# Patient Record
Sex: Male | Born: 1955 | Race: White | Hispanic: No | Smoking: Never smoker
Health system: Southern US, Community
[De-identification: ages and names within clinical notes are randomized; demographics above are authoritative.]

## PROBLEM LIST (undated history)

## (undated) DIAGNOSIS — I10 Essential (primary) hypertension: Secondary | ICD-10-CM

## (undated) DIAGNOSIS — E119 Type 2 diabetes mellitus without complications: Secondary | ICD-10-CM

## (undated) DIAGNOSIS — M109 Gout, unspecified: Secondary | ICD-10-CM

## (undated) DIAGNOSIS — N189 Chronic kidney disease, unspecified: Secondary | ICD-10-CM

## (undated) DIAGNOSIS — E039 Hypothyroidism, unspecified: Secondary | ICD-10-CM

## (undated) DIAGNOSIS — C73 Malignant neoplasm of thyroid gland: Secondary | ICD-10-CM

## (undated) HISTORY — PX: COLONOSCOPY: SHX174

---
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
## 2007-03-25 ENCOUNTER — Ambulatory Visit: Payer: Self-pay | Admitting: Gastroenterology

## 2012-04-13 ENCOUNTER — Ambulatory Visit: Payer: Self-pay | Admitting: Internal Medicine

## 2012-04-14 ENCOUNTER — Other Ambulatory Visit: Payer: Self-pay | Admitting: Podiatry

## 2012-04-14 LAB — SYNOVIAL CELL COUNT + DIFF, W/ CRYSTALS
Basophil: 0 %
Crystals, Joint Fluid: NONE SEEN
Eosinophil: 0 %
Lymphocytes: 68 %
Neutrophils: 3 %
Nucleated Cell Count: 68 /mm3
Other Cells BF: 0 %
Other Mononuclear Cells: 29 %

## 2014-11-02 DIAGNOSIS — C73 Malignant neoplasm of thyroid gland: Secondary | ICD-10-CM

## 2014-11-02 HISTORY — DX: Malignant neoplasm of thyroid gland: C73

## 2015-06-03 ENCOUNTER — Encounter
Admission: RE | Admit: 2015-06-03 | Discharge: 2015-06-03 | Disposition: A | Discharge status: Home / Self Care | Payer: PRIVATE HEALTH INSURANCE | Source: Ambulatory Visit | Attending: Otolaryngology | Admitting: Otolaryngology

## 2015-06-03 DIAGNOSIS — Z01810 Encounter for preprocedural cardiovascular examination: Secondary | ICD-10-CM | POA: Diagnosis not present

## 2015-06-03 DIAGNOSIS — Z01812 Encounter for preprocedural laboratory examination: Secondary | ICD-10-CM | POA: Insufficient documentation

## 2015-06-03 HISTORY — DX: Essential (primary) hypertension: I10

## 2015-06-03 HISTORY — DX: Type 2 diabetes mellitus without complications: E11.9

## 2015-06-03 HISTORY — DX: Chronic kidney disease, unspecified: N18.9

## 2015-06-03 LAB — BASIC METABOLIC PANEL
Anion gap: 8 (ref 5–15)
BUN: 20 mg/dL (ref 6–20)
CO2: 26 mmol/L (ref 22–32)
CREATININE: 1.06 mg/dL (ref 0.61–1.24)
Calcium: 9.3 mg/dL (ref 8.9–10.3)
Chloride: 103 mmol/L (ref 101–111)
Glucose, Bld: 155 mg/dL — ABNORMAL HIGH (ref 65–99)
Potassium: 3.2 mmol/L — ABNORMAL LOW (ref 3.5–5.1)
Sodium: 137 mmol/L (ref 135–145)

## 2015-06-03 NOTE — Pre-Procedure Instructions (Signed)
Met B results (potassium 3.2) faxed to Dr. Richardson Landry office, results given to [facility] and informed patient needed to be started on potassium supplement.Marland Kitchen

## 2015-06-03 NOTE — Patient Instructions (Signed)
  Your procedure is scheduled on: June 12, 2015 (Wednesday) Report to Day Surgery. To find out your arrival time please call [phone] between 1PM - 3PM on June 11, 2015 (Tuesday).  Remember: Instructions that are not followed completely may result in serious medical risk, up to and including death, or upon the discretion of your surgeon and anesthesiologist your surgery may need to be rescheduled.    __x__ 1. Do not eat food or drink liquids after midnight. No gum chewing or hard candies.     ____ 2. No Alcohol for 24 hours before or after surgery.   ____ 3. Bring all medications with you on the day of surgery if instructed.    ____ 4. Notify your doctor if there is any change in your medical condition     (cold, fever, infections).     Do not wear jewelry, make-up, hairpins, clips or nail polish.  Do not wear lotions, powders, or perfumes. You may wear deodorant.  Do not shave 48 hours prior to surgery. Men may shave face and neck.  Do not bring valuables to the hospital.    [facility] is not responsible for any belongings or valuables.               Contacts, dentures or bridgework may not be worn into surgery.  Leave your suitcase in the car. After surgery it may be brought to your room.  For patients admitted to the hospital, discharge time is determined by your                treatment team.   Patients discharged the day of surgery will not be allowed to drive home.   Please read over the following fact sheets that you were given:      __x__ Take these medicines the morning of surgery with A SIP OF WATER:    1. Metoprolol  2.   3.   4.  5.  6.  ____ Fleet Enema (as directed)   ____ Use CHG Soap as directed  ____ Use inhalers on the day of surgery  ____ Stop metformin 2 days prior to surgery    ____ Take 1/2 of usual insulin dose the night before surgery and none on the morning of surgery.   ____ Stop Coumadin/Plavix/aspirin on   __x__ Stop  Anti-inflammatories on (STOP [place])   ____ Stop supplements until after surgery.    ____ Bring C-Pap to the hospital.

## 2015-06-12 ENCOUNTER — Encounter
Admission: RE | Disposition: A | Discharge status: Home / Self Care | Payer: Self-pay | Source: Ambulatory Visit | Attending: Otolaryngology

## 2015-06-12 ENCOUNTER — Observation Stay
Admission: RE | Admit: 2015-06-12 | Discharge: 2015-06-13 | Disposition: A | Discharge status: Home / Self Care | Payer: PRIVATE HEALTH INSURANCE | Source: Ambulatory Visit | Attending: Otolaryngology | Admitting: Otolaryngology

## 2015-06-12 ENCOUNTER — Ambulatory Visit: Payer: PRIVATE HEALTH INSURANCE | Admitting: Anesthesiology

## 2015-06-12 ENCOUNTER — Encounter: Payer: Self-pay | Admitting: *Deleted

## 2015-06-12 DIAGNOSIS — C73 Malignant neoplasm of thyroid gland: Secondary | ICD-10-CM | POA: Diagnosis not present

## 2015-06-12 DIAGNOSIS — Z87442 Personal history of urinary calculi: Secondary | ICD-10-CM | POA: Diagnosis not present

## 2015-06-12 DIAGNOSIS — E1122 Type 2 diabetes mellitus with diabetic chronic kidney disease: Secondary | ICD-10-CM | POA: Insufficient documentation

## 2015-06-12 DIAGNOSIS — N182 Chronic kidney disease, stage 2 (mild): Secondary | ICD-10-CM | POA: Diagnosis not present

## 2015-06-12 DIAGNOSIS — Z833 Family history of diabetes mellitus: Secondary | ICD-10-CM | POA: Insufficient documentation

## 2015-06-12 DIAGNOSIS — Z809 Family history of malignant neoplasm, unspecified: Secondary | ICD-10-CM | POA: Diagnosis not present

## 2015-06-12 DIAGNOSIS — E063 Autoimmune thyroiditis: Secondary | ICD-10-CM | POA: Diagnosis not present

## 2015-06-12 DIAGNOSIS — Z7982 Long term (current) use of aspirin: Secondary | ICD-10-CM | POA: Diagnosis not present

## 2015-06-12 DIAGNOSIS — Z79899 Other long term (current) drug therapy: Secondary | ICD-10-CM | POA: Insufficient documentation

## 2015-06-12 DIAGNOSIS — I129 Hypertensive chronic kidney disease with stage 1 through stage 4 chronic kidney disease, or unspecified chronic kidney disease: Secondary | ICD-10-CM | POA: Insufficient documentation

## 2015-06-12 DIAGNOSIS — Z86010 Personal history of colonic polyps: Secondary | ICD-10-CM | POA: Diagnosis not present

## 2015-06-12 DIAGNOSIS — Z8249 Family history of ischemic heart disease and other diseases of the circulatory system: Secondary | ICD-10-CM | POA: Insufficient documentation

## 2015-06-12 DIAGNOSIS — E049 Nontoxic goiter, unspecified: Secondary | ICD-10-CM | POA: Diagnosis present

## 2015-06-12 DIAGNOSIS — E042 Nontoxic multinodular goiter: Secondary | ICD-10-CM | POA: Diagnosis present

## 2015-06-12 HISTORY — PX: THYROIDECTOMY: SHX17

## 2015-06-12 LAB — GLUCOSE, CAPILLARY
GLUCOSE-CAPILLARY: 130 mg/dL — AB (ref 65–99)
Glucose-Capillary: 128 mg/dL — ABNORMAL HIGH (ref 65–99)

## 2015-06-12 LAB — CALCIUM: CALCIUM: 8.4 mg/dL — AB (ref 8.9–10.3)

## 2015-06-12 SURGERY — THYROIDECTOMY
Anesthesia: General | Wound class: Clean

## 2015-06-12 MED ORDER — KCL IN DEXTROSE-NACL 20-5-0.45 MEQ/L-%-% IV SOLN
INTRAVENOUS | Status: DC
  Administered 2015-06-12 – 2015-06-13 (×3): via INTRAVENOUS
  Filled 2015-06-12 (×6): qty 1000

## 2015-06-12 MED ORDER — METOPROLOL TARTRATE 50 MG PO TABS: 100.0000 mg | ORAL_TABLET | Freq: Every morning | ORAL | Status: DC

## 2015-06-12 MED ORDER — LOSARTAN POTASSIUM 50 MG PO TABS: 100.0000 mg | ORAL_TABLET | Freq: Every day | ORAL | Status: DC

## 2015-06-12 MED ORDER — ONDANSETRON HCL 4 MG PO TABS: 4.0000 mg | ORAL_TABLET | ORAL | Status: DC | PRN

## 2015-06-12 MED ORDER — HYDROCHLOROTHIAZIDE 25 MG PO TABS: 25.0000 mg | ORAL_TABLET | Freq: Every day | ORAL | Status: DC

## 2015-06-12 MED ORDER — ONDANSETRON HCL 4 MG/2ML IJ SOLN: 4.0000 mg | INTRAMUSCULAR | Status: DC | PRN

## 2015-06-12 MED ORDER — DOCUSATE SODIUM 100 MG PO CAPS
100.0000 mg | ORAL_CAPSULE | Freq: Two times a day (BID) | ORAL | Status: DC
  Administered 2015-06-12 (×2): 100 mg via ORAL
  Filled 2015-06-12 (×2): qty 1

## 2015-06-12 MED ORDER — MORPHINE SULFATE 2 MG/ML IJ SOLN
2.0000 mg | INTRAMUSCULAR | Status: DC | PRN
Start: 2015-06-12 — End: 2015-06-13
  Administered 2015-06-12: 4 mg via INTRAVENOUS
  Filled 2015-06-12: qty 2

## 2015-06-12 MED ORDER — BACITRACIN 500 UNIT/GM EX OINT
TOPICAL_OINTMENT | CUTANEOUS | Status: DC | PRN
  Administered 2015-06-12: 1 via TOPICAL

## 2015-06-12 MED ORDER — FAMOTIDINE 20 MG PO TABS
20.0000 mg | ORAL_TABLET | Freq: Once | ORAL | Status: AC
  Administered 2015-06-12: 20 mg via ORAL

## 2015-06-12 MED ORDER — FENTANYL CITRATE (PF) 100 MCG/2ML IJ SOLN
25.0000 ug | INTRAMUSCULAR | Status: AC | PRN
  Administered 2015-06-12 (×6): 25 ug via INTRAVENOUS

## 2015-06-12 MED ORDER — BACITRACIN ZINC 500 UNIT/GM EX OINT
1.0000 "application " | TOPICAL_OINTMENT | Freq: Three times a day (TID) | CUTANEOUS | Status: DC
  Administered 2015-06-12 – 2015-06-13 (×3): 1 via TOPICAL
  Filled 2015-06-12: qty 28.35

## 2015-06-12 MED ORDER — ONDANSETRON HCL 4 MG/2ML IJ SOLN: 4.0000 mg | Freq: Once | INTRAMUSCULAR | Status: DC | PRN

## 2015-06-12 MED ORDER — SODIUM CHLORIDE 0.9 % IV SOLN
10000.0000 ug | INTRAVENOUS | Status: DC | PRN
  Administered 2015-06-12: 50 ug/min via INTRAVENOUS

## 2015-06-12 MED ORDER — LIDOCAINE-EPINEPHRINE (PF) 1 %-1:200000 IJ SOLN
INTRAMUSCULAR | Status: AC
  Filled 2015-06-12: qty 30

## 2015-06-12 MED ORDER — PROPOFOL 10 MG/ML IV BOLUS
INTRAVENOUS | Status: DC | PRN
  Administered 2015-06-12: 170 mg via INTRAVENOUS

## 2015-06-12 MED ORDER — FAMOTIDINE 20 MG PO TABS
ORAL_TABLET | ORAL | Status: AC
  Filled 2015-06-12: qty 1

## 2015-06-12 MED ORDER — SUCCINYLCHOLINE CHLORIDE 20 MG/ML IJ SOLN
INTRAMUSCULAR | Status: DC | PRN
  Administered 2015-06-12: 120 mg via INTRAVENOUS

## 2015-06-12 MED ORDER — SODIUM CHLORIDE 0.9 % IV SOLN
INTRAVENOUS | Status: DC
  Administered 2015-06-12 (×2): via INTRAVENOUS

## 2015-06-12 MED ORDER — CALCIUM CARBONATE-VITAMIN D 500-200 MG-UNIT PO TABS
2.0000 | ORAL_TABLET | Freq: Two times a day (BID) | ORAL | Status: DC
  Administered 2015-06-12 (×2): 2 via ORAL
  Filled 2015-06-12 (×3): qty 2

## 2015-06-12 MED ORDER — LIDOCAINE HCL (CARDIAC) 20 MG/ML IV SOLN
INTRAVENOUS | Status: DC | PRN
  Administered 2015-06-12: 60 mg via INTRAVENOUS

## 2015-06-12 MED ORDER — LIDOCAINE-EPINEPHRINE (PF) 1 %-1:200000 IJ SOLN
INTRAMUSCULAR | Status: DC | PRN
  Administered 2015-06-12: 6 mL

## 2015-06-12 MED ORDER — HYDROCODONE-ACETAMINOPHEN 5-325 MG PO TABS
1.0000 | ORAL_TABLET | ORAL | Status: DC | PRN
Start: 2015-06-12 — End: 2015-06-13
  Administered 2015-06-12 (×2): 2 via ORAL
  Filled 2015-06-12 (×2): qty 2

## 2015-06-12 MED ORDER — LEVOTHYROXINE SODIUM 75 MCG PO TABS
125.0000 ug | ORAL_TABLET | Freq: Every day | ORAL | Status: DC
  Administered 2015-06-13: 125 ug via ORAL
  Filled 2015-06-12: qty 1

## 2015-06-12 MED ORDER — BACITRACIN ZINC 500 UNIT/GM EX OINT
TOPICAL_OINTMENT | CUTANEOUS | Status: AC
  Filled 2015-06-12: qty 28.35

## 2015-06-12 MED ORDER — EPHEDRINE SULFATE 50 MG/ML IJ SOLN
INTRAMUSCULAR | Status: DC | PRN
  Administered 2015-06-12 (×2): 10 mg via INTRAVENOUS
  Administered 2015-06-12: 5 mg via INTRAVENOUS

## 2015-06-12 MED ORDER — HYDROCHLOROTHIAZIDE 25 MG PO TABS
25.0000 mg | ORAL_TABLET | Freq: Every day | ORAL | Status: DC
  Filled 2015-06-12 (×2): qty 1

## 2015-06-12 MED ORDER — LOSARTAN POTASSIUM 50 MG PO TABS
100.0000 mg | ORAL_TABLET | Freq: Every day | ORAL | Status: DC
  Filled 2015-06-12 (×2): qty 2

## 2015-06-12 MED ORDER — FENTANYL CITRATE (PF) 100 MCG/2ML IJ SOLN
INTRAMUSCULAR | Status: AC
  Administered 2015-06-12: 25 ug via INTRAVENOUS
  Filled 2015-06-12: qty 2

## 2015-06-12 MED ORDER — MIDAZOLAM HCL 2 MG/2ML IJ SOLN
INTRAMUSCULAR | Status: DC | PRN
  Administered 2015-06-12: 2 mg via INTRAVENOUS

## 2015-06-12 MED ORDER — 0.9 % SODIUM CHLORIDE (POUR BTL) OPTIME
TOPICAL | Status: DC | PRN
  Administered 2015-06-12: 150 mL

## 2015-06-12 MED ORDER — FENTANYL CITRATE (PF) 100 MCG/2ML IJ SOLN
INTRAMUSCULAR | Status: DC | PRN
  Administered 2015-06-12: 25 ug via INTRAVENOUS
  Administered 2015-06-12 (×2): 50 ug via INTRAVENOUS
  Administered 2015-06-12: 25 ug via INTRAVENOUS
  Administered 2015-06-12: 100 ug via INTRAVENOUS
  Administered 2015-06-12: 25 ug via INTRAVENOUS
  Administered 2015-06-12 (×2): 50 ug via INTRAVENOUS
  Administered 2015-06-12: 100 ug via INTRAVENOUS

## 2015-06-12 MED ORDER — PHENYLEPHRINE HCL 10 MG/ML IJ SOLN
INTRAMUSCULAR | Status: DC | PRN
  Administered 2015-06-12 (×2): 100 ug via INTRAVENOUS
  Administered 2015-06-12: 200 ug via INTRAVENOUS

## 2015-06-12 MED ORDER — ONDANSETRON HCL 4 MG/2ML IJ SOLN
INTRAMUSCULAR | Status: DC | PRN
  Administered 2015-06-12: 4 mg via INTRAVENOUS

## 2015-06-12 SURGICAL SUPPLY — 36 items
BLADE SURG 15 STRL LF DISP TIS (BLADE) ×1 IMPLANT
BLADE SURG 15 STRL SS (BLADE) ×2
CANISTER SUCT 1200ML W/VALVE (MISCELLANEOUS) ×3 IMPLANT
CORD BIP STRL DISP 12FT (MISCELLANEOUS) ×3 IMPLANT
DRAIN TLS ROUND 10FR (DRAIN) ×6 IMPLANT
DRAPE MAG INST 16X20 L/F (DRAPES) ×3 IMPLANT
DRSG TEGADERM 2-3/8X2-3/4 SM (GAUZE/BANDAGES/DRESSINGS) IMPLANT
ELECT LARYNGEAL 6/7 (MISCELLANEOUS)
ELECT LARYNGEAL 8/9 (MISCELLANEOUS) ×3
ELECTRODE LARYNGEAL 6/7 (MISCELLANEOUS) IMPLANT
ELECTRODE LARYNGEAL 8/9 (MISCELLANEOUS) ×1 IMPLANT
FORCEPS JEWEL BIP 4-3/4 STR (INSTRUMENTS) ×3 IMPLANT
GLOVE BIO SURGEON STRL SZ7.5 (GLOVE) ×18 IMPLANT
GOWN STRL REUS W/ TWL LRG LVL3 (GOWN DISPOSABLE) ×4 IMPLANT
GOWN STRL REUS W/TWL LRG LVL3 (GOWN DISPOSABLE) ×8
HARMONIC SCALPEL FOCUS (MISCELLANEOUS) ×3 IMPLANT
HEMOSTAT SURGICEL 2X3 (HEMOSTASIS) ×3 IMPLANT
HOOK STAY BLUNT/RETRACTOR 5M (MISCELLANEOUS) ×3 IMPLANT
KIT RM TURNOVER STRD PROC AR (KITS) ×3 IMPLANT
LABEL OR SOLS (LABEL) ×3 IMPLANT
NS IRRIG 500ML POUR BTL (IV SOLUTION) ×3 IMPLANT
PACK HEAD/NECK (MISCELLANEOUS) ×3 IMPLANT
PAD GROUND ADULT SPLIT (MISCELLANEOUS) ×3 IMPLANT
PROBE NEUROSIGN BIPOL (MISCELLANEOUS) ×1 IMPLANT
PROBE NEUROSIGN BIPOLAR (MISCELLANEOUS) ×2
SPONGE KITTNER 5P (MISCELLANEOUS) ×9 IMPLANT
SPONGE XRAY 4X4 16PLY STRL (MISCELLANEOUS) ×6 IMPLANT
SUT ETHILON 4-0 (SUTURE) ×2
SUT ETHILON 4-0 FS2 18XMFL BLK (SUTURE) ×1
SUT PROLENE 3 0 PS 2 (SUTURE) ×3 IMPLANT
SUT SILK 2 0 (SUTURE) ×2
SUT SILK 2 0 SH (SUTURE) IMPLANT
SUT SILK 2-0 18XBRD TIE 12 (SUTURE) ×1 IMPLANT
SUT VIC AB 4-0 RB1 18 (SUTURE) ×3 IMPLANT
SUTURE ETHLN 4-0 FS2 18XMF BLK (SUTURE) ×1 IMPLANT
SYSTEM CHEST DRAIN TLS 7FR (DRAIN) IMPLANT

## 2015-06-12 NOTE — Anesthesia Postprocedure Evaluation (Signed)
  Anesthesia Post-op Note  Patient: Donald Chapman  Procedure(s) Performed: Procedure(s): THYROIDECTOMY, Total  (N/A)  Anesthesia type:General  Patient location: PACU  Post pain: Pain level controlled  Post assessment: Post-op Vital signs reviewed, Patient's Cardiovascular Status Stable, Respiratory Function Stable, Patent Airway and No signs of Nausea or vomiting  Post vital signs: Reviewed and stable  Last Vitals:  Filed Vitals:   06/12/15 1048  BP: 121/75  Pulse: 97  Temp: 38.1 C  Resp: 14    Level of consciousness: awake, alert  and patient cooperative  Complications: No apparent anesthesia complications

## 2015-06-12 NOTE — Progress Notes (Signed)
Donald Chapman, Donald Chapman [id number] [birth year] Donald Nearing, MD   SUBJECTIVE: This 59 y.o. year old male is status post THYROIDECTOMY. He appears to be doing well. Postop calcium is 8.4 and will be carefully monitored. Some minor sore throat but that seems to be improving and he is tolerating liquids. He denies any paresthesias of the extremities or midface.  Medications:  Current Facility-Administered Medications  Medication Dose Route Frequency Provider Last Rate Last Dose  . bacitracin ointment 1 application  1 application Topical 3 times per day Clyde Canterbury, MD   1 application at 09/98/33 1506  . calcium-vitamin D (OSCAL WITH D) 500-200 MG-UNIT per tablet 2 tablet  2 tablet Oral BID Clyde Canterbury, MD   2 tablet at 06/12/15 1229  . dextrose 5 % and 0.45 % NaCl with KCl 20 mEq/L infusion   Intravenous Continuous Clyde Canterbury, MD 100 mL/hr at 06/12/15 1229    . docusate sodium (COLACE) capsule 100 mg  100 mg Oral BID Clyde Canterbury, MD   100 mg at 06/12/15 1229  . famotidine (PEPCID) 20 MG tablet           . [START ON 06/13/2015] hydrochlorothiazide (HYDRODIURIL) tablet 25 mg  25 mg Oral Daily Clyde Canterbury, MD      . HYDROcodone-acetaminophen (NORCO/VICODIN) 5-325 MG per tablet 1-2 tablet  1-2 tablet Oral Q4H PRN Clyde Canterbury, MD   2 tablet at 06/12/15 1506  . [START ON 06/13/2015] losartan (COZAAR) tablet 100 mg  100 mg Oral Daily Clyde Canterbury, MD      . Derrill Memo ON 06/13/2015] metoprolol (LOPRESSOR) tablet 100 mg  100 mg Oral q morning - 10a Clyde Canterbury, MD      . morphine 2 MG/ML injection 2-4 mg  2-4 mg Intravenous Q2H PRN Clyde Canterbury, MD   4 mg at 06/12/15 1212  . ondansetron (ZOFRAN) tablet 4 mg  4 mg Oral Q4H PRN Clyde Canterbury, MD       Or  . ondansetron [facility]) injection 4 mg  4 mg Intravenous Q4H PRN Clyde Canterbury, MD      .  Medications Prior to Admission  Medication Sig Dispense Refill  . aspirin-acetaminophen-caffeine (EXCEDRIN MIGRAINE) 250-250-65 MG per tablet Take 2 tablets by mouth every 6  (six) hours as needed for headache.    . Calcium Carb-Cholecalciferol (CALCIUM + D3 PO) Take 1 tablet by mouth 2 (two) times daily.    Marland Kitchen losartan-hydrochlorothiazide (HYZAAR) 100-25 MG per tablet Take 1 tablet by mouth daily.    . metoprolol succinate (TOPROL-XL) 100 MG 24 hr tablet Take 100 mg by mouth daily. Take with or immediately following a meal.      OBJECTIVE:  PHYSICAL EXAM  Vitals: Blood pressure 131/75, pulse 81, temperature 97.7 F (36.5 C), temperature source Oral, resp. rate 16, weight 110.224 kg (243 lb), SpO2 93 %.. General: Well-developed, Well-nourished in no acute distress Mood: Mood and affect well adjusted, pleasant and cooperative. Orientation: Grossly alert and oriented. Vocal Quality: No hoarseness. Communicates verbally. head and Face: NCAT. No facial asymmetry. No visible skin lesions. No significant facial scars. No tenderness with sinus percussion. Facial strength normal and symmetric. Neck: Supple and symmetric with no palpable masses, tenderness or crepitance. The trachea is midline. The neck wound is healing appropriately and there is no evidence of hematoma. His drains are in place and functional. Respiratory: Normal respiratory effort without labored breathing.  MEDICAL DECISION MAKING: Data Review:  Results for orders placed or performed during the hospital encounter of 06/12/15 (  from the past 48 hour(s))  Glucose, capillary     Status: Abnormal   Collection Time: 06/12/15  6:31 AM  Result Value Ref Range   Glucose-Capillary 128 (H) 65 - 99 mg/dL  Glucose, capillary     Status: Abnormal   Collection Time: 06/12/15 10:51 AM  Result Value Ref Range   Glucose-Capillary 130 (H) 65 - 99 mg/dL  Calcium     Status: Abnormal   Collection Time: 06/12/15  3:37 PM  Result Value Ref Range   Calcium 8.4 (L) 8.9 - 10.3 mg/dL  . No results found..   ASSESSMENT: Status post total thyroidectomy for huge thyroid goiter, currently doing well.  PLAN: He will try to  advance his diet and we will recheck his calcium in the morning. He will continue by mouth supplemental calcium. Discharge will depend on the status of his calcium tomorrow and whether there is a considerable drop in the calcium level, or if it is stable. I will also go ahead and start him on Synthroid.   Donald Nearing, MD 06/12/2015 6:17 PM

## 2015-06-12 NOTE — Transfer of Care (Signed)
Immediate Anesthesia Transfer of Care Note  Patient: Donald Chapman  Procedure(s) Performed: Procedure(s): THYROIDECTOMY, Total  (N/A)  Patient Location: PACU  Anesthesia Type:General  Level of Consciousness: awake and alert   Airway & Oxygen Therapy: Patient Spontanous Breathing and Patient connected to face mask oxygen  Post-op Assessment: Report given to RN and Post -op Vital signs reviewed and stable  Post vital signs: Reviewed and stable  Last Vitals:  Filed Vitals:   06/12/15 1048  BP: 121/75  Pulse: 97  Temp: 38.1 C  Resp: 14    Complications: No apparent anesthesia complications

## 2015-06-12 NOTE — H&P (Signed)
History and physical reviewed and will be scanned in later. No change in medical status reported by the patient or family, appears stable for surgery. All questions regarding the procedure answered, and patient (or family if a child) expressed understanding of the procedure.  Donald Chapman S @TODAY@ 

## 2015-06-12 NOTE — Op Note (Signed)
06/12/2015  10:42 AM    Donald Chapman  [id number]   Pre-Op Diagnosis:  Multinodular goiter Post-op Diagnosis: Multinodular goiter  Procedure:   Total thyroidectomy Surgeon:  [name] First Assistant: Margaretha Sheffield   Anesthesia:  General endotracheal  EBL:  628 cc  Complications:  None  Findings: Large nodule of the left thyroid lobe at least 7 cm in greatest dimension, with multiple small nodules in the right lobe of the gland. The right lobe was moderately enlarged.   Procedure: After the patient was identified in holding and the procedure was reviewed.  The patient was taken to the operating room and with the patient in a comfortable supine position,  general endotracheal anesthesia was induced without difficulty.  A nerve monitor on the endotracheal tube was visualized to be between the cords at the time of intubation. A proper time-out was performed, confirming the operative site and procedure.  The patient was placed on a shoulder roll and position. Skin was injected with 1% lidocaine with epinephrine 1-200,000. The patient was then prepped and draped in the usual sterile fashion. A 15 blade was used to incise the skin carrying the incision down through the subcutaneous tissues. The platysma muscle was divided with the Bovie. The strap muscles were identified in the midline and divided in the midline, retracting them laterally. Small anterior jugular veins were either clamped and suture divided or divided with the Harmonic scalpel. The strap muscles were retracted laterally off of the capsule of the left lobe of the thyroid gland. This was then finger dissected to loosen loose attachments to the gland. Visualization was difficult due to the massive size of the left lobe of the gland. The strap muscles were divided with the Harmonic scalpel to facilitate dissection. Dissection proceeded superiorly, dissecting the superior pole of the gland. The superior pole vessels were  divided with the Harmonic scalpel. A small parathyroid gland was visualized and preserved during this dissection. The superior pole was retracted inferiorly and dissection proceeded around the lateral aspect of the gland, dividing vascular attachments with the Harmonic scalpel. The inferior pole was dissected, identifying a small structure consistent with the parathyroid, and the inferior pole vessels were divided with the Harmonic scalpel. Care was taken to divide all of these vessels right at the capsule of the gland. There was a vein in the deep aspect of the wound below the left lobe of the gland that had to be suture ligated. The gland was then retracted medially and delivered from the wound. Dissection along the trachea revealed the recurrent laryngeal nerve which stimulated properly. The gland was then dissected off of the trachea, dividing Berry's ligament with the nerve carefully protected. The left lobe of the gland was then divided at the isthmus and set aside. This lobe of the gland was particularly vascular and there was quite a bit of bleeding during the dissection but this was well controlled at the completion of the dissection of the left lobe. The difficulty of this side accounted for most of the blood loss.  Next the right lobe of the thyroid gland was dissected in the same fashion as described above. Once again the superior and inferior pole vessels were divided right at the capsule of the gland and structures consistent with parathyroid tissue were identified both superiorly and inferiorly. Retracting the gland medially the recurrent laryngeal nerve was identified and confirmed with the stimulator. This was then carefully protected as the gland was dissected away from the trachea  and Berry's ligament divided. The right lobe was delivered and sent as a specimen.   The wound was then irrigated and inspected for bleeding. #10 TLS drains were placed on either side of the trachea with the drains  coming out through the skin just below the wound. The drains were secured with 4-0  Ethilon suture. Surgicel was placed on either side of the trachea to control minor oozing within the wound. The strap muscles were then reapproximated with 4-0 Vicryl suture. The platysma and subcutaneous tissues were also closed with 4-0 Vicryl suture. The skin was closed with a 3-0 running subcuticular Prolene suture. The drains were then placed to suction with Hemovac tubes. The patient was then returned to the anesthesiologist for awakening. The patient was awakened and taken to recovery room in good condition postoperatively.  The patient was then returned to the anesthesiologist in good condition for awakening. The patient was awakened and taken to the recovery room in good condition.   Disposition:   PACU then transferred to floor  Plan: The patient is to be admitted for observation and monitoring of serum calcium, with potential discharge tomorrow if serum calcium is stable overnight.  [name] 06/12/2015 10:42 AM

## 2015-06-12 NOTE — H&P (Signed)
  Donald Chapman, Donald Chapman [id number] 1956/05/21  Date of Admission: '@TODAY'$ @ Admitting Physician: [name]  Chief Complaint: Thyroid goiter   HPI: This 59 y.o. year old male with large thyroid goiter presents for thyroidectomy. See scanned H&P for full details.  Medications:  Medications Prior to Admission  Medication Sig Dispense Refill  . aspirin-acetaminophen-caffeine (EXCEDRIN MIGRAINE) 250-250-65 MG per tablet Take 2 tablets by mouth every 6 (six) hours as needed for headache.    . Calcium Carb-Cholecalciferol (CALCIUM + D3 PO) Take 1 tablet by mouth 2 (two) times daily.    Marland Kitchen losartan-hydrochlorothiazide (HYZAAR) 100-25 MG per tablet Take 1 tablet by mouth daily.    . metoprolol succinate (TOPROL-XL) 100 MG 24 hr tablet Take 100 mg by mouth daily. Take with or immediately following a meal.      Allergies: No Known Allergies  PMH:  Past Medical History  Diagnosis Date  . Hypertension   . Diabetes mellitus without complication   . Chronic kidney disease     kidney stones in past    Fam Hx:  Family History  Problem Relation Age of Onset  . Diabetes Sister   . Hypertension Sister     Soc Hx:  Social History   Social History  . Marital Status: Divorced    Spouse Name: N/A  . Number of Children: N/A  . Years of Education: N/A   Occupational History  . Not on file.   Social History Main Topics  . Smoking status: Never Smoker   . Smokeless tobacco: Never Used  . Alcohol Use: No  . Drug Use: No  . Sexual Activity: Not on file   Other Topics Concern  . Not on file   Social History Narrative    PSH: History reviewed. No pertinent past surgical history..   ROS: Negative for recent cough, fever.   PHYSICAL EXAM  Vitals: Blood pressure 151/94, pulse 72, temperature 98.1 F (36.7 C), temperature source Oral, resp. rate 16, SpO2 96 %.. General: Well-developed, Well-nourished in no acute distress Mood: Mood and affect well adjusted, pleasant and  cooperative. Orientation: Grossly alert and oriented. Vocal Quality: No hoarseness. Communicates verbally. head and Face: NCAT. No facial asymmetry. No visible skin lesions. No significant facial scars. No tenderness with sinus percussion. Facial strength normal and symmetric. Hearing: Speech reception grossly normal. Neck: Supple and symmetric with large thyroid gland, no tenderness or crepitance. The trachea is midline. Thyroid gland is diffusely enlarged, nontender and larger on the left. Parotid and submandibular glands are soft, nontender and symmetric, without masses. Lymphatic: Cervical lymph nodes are without palpable lymphadenopathy or tenderness. Respiratory: Normal respiratory effort without labored breathing. Cardiovascular: Carotid pulse shows regular rate and rhythm Neurologic: Cranial Nerves II through XII are grossly intact. Eyes: Gaze and Ocular Motility are grossly normal. PERRLA. No visible nystagmus.  MEDICAL DECISION MAKING: Data Review:  Results for orders placed or performed during the hospital encounter of 06/12/15 (from the past 48 hour(s))  Glucose, capillary     Status: Abnormal   Collection Time: 06/12/15  6:31 AM  Result Value Ref Range   Glucose-Capillary 128 (H) 65 - 99 mg/dL  . No results found..   ASSESSMENT: Thyroid goiter PLAN: Total thyroidectomy   [name] 06/12/2015 7:20 AM

## 2015-06-12 NOTE — Anesthesia Preprocedure Evaluation (Addendum)
Anesthesia Evaluation  Patient identified by MRN, date of birth, ID band Patient awake    Reviewed: Allergy & Precautions, NPO status , Patient's Chart, lab work & pertinent test results  History of Anesthesia Complications Negative for: history of anesthetic complications  Airway Mallampati: III  TM Distance: >3 FB Neck ROM: Full    Dental  (+) Teeth Intact   Pulmonary          Cardiovascular hypertension, Pt. on medications and Pt. on home beta blockers     Neuro/Psych    GI/Hepatic   Endo/Other  diabetes (no meds, "prediabetes")  Renal/GU Renal disease: pt denies, stoens.     Musculoskeletal   Abdominal   Peds  Hematology   Anesthesia Other Findings   Reproductive/Obstetrics                            Anesthesia Physical Anesthesia Plan  ASA: II  Anesthesia Plan: General   Post-op Pain Management:    Induction: Intravenous  Airway Management Planned: Oral ETT  Additional Equipment:   Intra-op Plan:   Post-operative Plan:   Informed Consent: I have reviewed the patients History and Physical, chart, labs and discussed the procedure including the risks, benefits and alternatives for the proposed anesthesia with the patient or authorized representative who has indicated his/her understanding and acceptance.     Plan Discussed with:   Anesthesia Plan Comments:        Anesthesia Quick Evaluation

## 2015-06-13 DIAGNOSIS — C73 Malignant neoplasm of thyroid gland: Secondary | ICD-10-CM | POA: Diagnosis not present

## 2015-06-13 LAB — SURGICAL PATHOLOGY

## 2015-06-13 LAB — CALCIUM: Calcium: 8.5 mg/dL — ABNORMAL LOW (ref 8.9–10.3)

## 2015-06-13 MED ORDER — HYDROCODONE-ACETAMINOPHEN 5-325 MG PO TABS: 1.0000 | ORAL_TABLET | ORAL | Status: DC | PRN

## 2015-06-13 MED ORDER — LEVOTHYROXINE SODIUM 125 MCG PO TABS: 125.0000 ug | ORAL_TABLET | Freq: Every day | ORAL | Status: AC

## 2015-06-13 MED ORDER — DOCUSATE SODIUM 100 MG PO CAPS: 100.0000 mg | ORAL_CAPSULE | Freq: Two times a day (BID) | ORAL | Status: DC

## 2015-06-13 NOTE — Progress Notes (Signed)
Pt to be discharged per MD order. IV removed. All instructions reviewed with patient, all questions answered. Educated pt on signs/symptoms of low calcium. To be taken out in wheelchair when ride arrives.

## 2015-06-13 NOTE — Progress Notes (Signed)
Patient ID: Donald Chapman, male   DOB: 02/10/1956, 59 y.o.   MRN: [id number] Donald, Chapman [id number] 05-Jan-1956 Donald Nearing, MD   SUBJECTIVE: This 59 y.o. year old male is status post THYROIDECTOMY. Patient appears to be doing well. He was having some leak around the drains overnight. Drain output has decreased so the drains were removed. He is advancing his diet and seems to have reasonable pain control. The calcium is stable actually improving slightly this morning.  Medications:  Current Facility-Administered Medications  Medication Dose Route Frequency Provider Last Rate Last Dose  . bacitracin ointment 1 application  1 application Topical 3 times per day Clyde Canterbury, MD   1 application at 14/43/15 0522  . calcium-vitamin D (OSCAL WITH D) 500-200 MG-UNIT per tablet 2 tablet  2 tablet Oral BID Clyde Canterbury, MD   2 tablet at 06/12/15 2133  . dextrose 5 % and 0.45 % NaCl with KCl 20 mEq/L infusion   Intravenous Continuous Clyde Canterbury, MD 100 mL/hr at 06/13/15 0743    . docusate sodium (COLACE) capsule 100 mg  100 mg Oral BID Clyde Canterbury, MD   100 mg at 06/12/15 2133  . hydrochlorothiazide (HYDRODIURIL) tablet 25 mg  25 mg Oral Daily Clyde Canterbury, MD      . HYDROcodone-acetaminophen (NORCO/VICODIN) 5-325 MG per tablet 1-2 tablet  1-2 tablet Oral Q4H PRN Clyde Canterbury, MD   2 tablet at 06/12/15 2143  . levothyroxine (SYNTHROID, LEVOTHROID) tablet 125 mcg  125 mcg Oral QAC breakfast Clyde Canterbury, MD   125 mcg at 06/13/15 0734  . losartan (COZAAR) tablet 100 mg  100 mg Oral Daily Clyde Canterbury, MD      . metoprolol (LOPRESSOR) tablet 100 mg  100 mg Oral q morning - 10a Clyde Canterbury, MD      . morphine 2 MG/ML injection 2-4 mg  2-4 mg Intravenous Q2H PRN Clyde Canterbury, MD   4 mg at 06/12/15 1212  . ondansetron (ZOFRAN) tablet 4 mg  4 mg Oral Q4H PRN Clyde Canterbury, MD       Or  . ondansetron [facility]) injection 4 mg  4 mg Intravenous Q4H PRN Clyde Canterbury, MD      .  Medications Prior to Admission   Medication Sig Dispense Refill  . aspirin-acetaminophen-caffeine (EXCEDRIN MIGRAINE) 250-250-65 MG per tablet Take 2 tablets by mouth every 6 (six) hours as needed for headache.    . Calcium Carb-Cholecalciferol (CALCIUM + D3 PO) Take 1 tablet by mouth 2 (two) times daily.    Marland Kitchen losartan-hydrochlorothiazide (HYZAAR) 100-25 MG per tablet Take 1 tablet by mouth daily.    . metoprolol succinate (TOPROL-XL) 100 MG 24 hr tablet Take 100 mg by mouth daily. Take with or immediately following a meal.      OBJECTIVE:  PHYSICAL EXAM  Vitals: Blood pressure 158/85, pulse 80, temperature 97.4 F (36.3 C), temperature source Oral, resp. rate 18, weight 110.224 kg (243 lb), SpO2 94 %.. General: Well-developed, Well-nourished in no acute distress Mood: Mood and affect well adjusted, pleasant and cooperative. Orientation: Grossly alert and oriented. Vocal Quality: No hoarseness. Communicates verbally. head and Face: NCAT. No facial asymmetry. No visible skin lesions. No significant facial scars. No tenderness with sinus percussion. Facial strength normal and symmetric. Neck: Supple and symmetric with no palpable masses, tenderness or crepitance. The trachea is midline. The neck wound is healing appropriately with no excessive swelling and no evidence of hematoma. The drains were removed. Sutures are in place with no  sign of infection.  Respiratory: Normal respiratory effort without labored breathing.  MEDICAL DECISION MAKING: Data Review:  Results for orders placed or performed during the hospital encounter of 06/12/15 (from the past 48 hour(s))  Glucose, capillary     Status: Abnormal   Collection Time: 06/12/15  6:31 AM  Result Value Ref Range   Glucose-Capillary 128 (H) 65 - 99 mg/dL  Glucose, capillary     Status: Abnormal   Collection Time: 06/12/15 10:51 AM  Result Value Ref Range   Glucose-Capillary 130 (H) 65 - 99 mg/dL  Calcium     Status: Abnormal   Collection Time: 06/12/15  3:37 PM   Result Value Ref Range   Calcium 8.4 (L) 8.9 - 10.3 mg/dL  Calcium     Status: Abnormal   Collection Time: 06/13/15  5:45 AM  Result Value Ref Range   Calcium 8.5 (L) 8.9 - 10.3 mg/dL  . No results found..   ASSESSMENT/PLAN: Status post total thyroidectomy doing well. He will be discharged on Synthroid and pain medication. I will see him back in 1 week for suture removal. Ice may be applied to the neck wound as needed for swelling he should avoid heavy lifting and sleep with the head elevated.    Donald Nearing, MD 06/13/2015 8:29 AM

## 2015-07-04 ENCOUNTER — Other Ambulatory Visit: Payer: Self-pay | Admitting: Internal Medicine

## 2015-07-04 DIAGNOSIS — C73 Malignant neoplasm of thyroid gland: Secondary | ICD-10-CM

## 2015-07-24 ENCOUNTER — Ambulatory Visit
Admission: RE | Admit: 2015-07-24 | Discharge: 2015-07-24 | Disposition: A | Discharge status: Home / Self Care | Payer: PRIVATE HEALTH INSURANCE | Source: Ambulatory Visit | Attending: Internal Medicine | Admitting: Internal Medicine

## 2015-07-24 DIAGNOSIS — C73 Malignant neoplasm of thyroid gland: Secondary | ICD-10-CM | POA: Insufficient documentation

## 2015-07-24 MED ORDER — SODIUM IODIDE I 131 CAPSULE
77.1000 | Freq: Once | INTRAVENOUS | Status: AC | PRN
  Administered 2015-07-24: 77.1 via ORAL

## 2015-08-02 ENCOUNTER — Inpatient Hospital Stay: Admission: RE | Admit: 2015-08-02 | Payer: PRIVATE HEALTH INSURANCE | Source: Ambulatory Visit

## 2015-08-02 ENCOUNTER — Encounter
Admission: RE | Admit: 2015-08-02 | Discharge: 2015-08-02 | Disposition: A | Discharge status: Home / Self Care | Payer: PRIVATE HEALTH INSURANCE | Source: Ambulatory Visit | Attending: Internal Medicine | Admitting: Internal Medicine

## 2015-08-02 DIAGNOSIS — C73 Malignant neoplasm of thyroid gland: Secondary | ICD-10-CM | POA: Insufficient documentation

## 2016-01-10 ENCOUNTER — Encounter: Payer: Self-pay | Admitting: *Deleted

## 2016-01-10 NOTE — Discharge Instructions (Signed)

## 2016-01-15 ENCOUNTER — Encounter
Admission: RE | Disposition: A | Discharge status: Home / Self Care | Payer: Self-pay | Source: Ambulatory Visit | Attending: Ophthalmology

## 2016-01-15 ENCOUNTER — Ambulatory Visit: Payer: PRIVATE HEALTH INSURANCE | Admitting: Anesthesiology

## 2016-01-15 ENCOUNTER — Ambulatory Visit
Admission: RE | Admit: 2016-01-15 | Discharge: 2016-01-15 | Disposition: A | Discharge status: Home / Self Care | Payer: PRIVATE HEALTH INSURANCE | Source: Ambulatory Visit | Attending: Ophthalmology | Admitting: Ophthalmology

## 2016-01-15 DIAGNOSIS — Z79899 Other long term (current) drug therapy: Secondary | ICD-10-CM | POA: Diagnosis not present

## 2016-01-15 DIAGNOSIS — M109 Gout, unspecified: Secondary | ICD-10-CM | POA: Diagnosis not present

## 2016-01-15 DIAGNOSIS — E890 Postprocedural hypothyroidism: Secondary | ICD-10-CM | POA: Insufficient documentation

## 2016-01-15 DIAGNOSIS — I10 Essential (primary) hypertension: Secondary | ICD-10-CM | POA: Diagnosis not present

## 2016-01-15 DIAGNOSIS — H2511 Age-related nuclear cataract, right eye: Secondary | ICD-10-CM | POA: Diagnosis not present

## 2016-01-15 DIAGNOSIS — Z85850 Personal history of malignant neoplasm of thyroid: Secondary | ICD-10-CM | POA: Diagnosis not present

## 2016-01-15 DIAGNOSIS — H269 Unspecified cataract: Secondary | ICD-10-CM | POA: Diagnosis present

## 2016-01-15 DIAGNOSIS — N289 Disorder of kidney and ureter, unspecified: Secondary | ICD-10-CM | POA: Diagnosis not present

## 2016-01-15 HISTORY — DX: Gout, unspecified: M10.9

## 2016-01-15 HISTORY — DX: Hypothyroidism, unspecified: E03.9

## 2016-01-15 HISTORY — PX: CATARACT EXTRACTION W/PHACO: SHX586

## 2016-01-15 SURGERY — PHACOEMULSIFICATION, CATARACT, WITH IOL INSERTION
Anesthesia: Monitor Anesthesia Care | Site: Eye | Laterality: Right | Wound class: Clean

## 2016-01-15 MED ORDER — TIMOLOL MALEATE 0.5 % OP SOLN
OPHTHALMIC | Status: DC | PRN
  Administered 2016-01-15: 1 [drp] via OPHTHALMIC

## 2016-01-15 MED ORDER — POVIDONE-IODINE 5 % OP SOLN
1.0000 "application " | OPHTHALMIC | Status: DC | PRN
  Administered 2016-01-15: 1 via OPHTHALMIC

## 2016-01-15 MED ORDER — ARMC OPHTHALMIC DILATING GEL
1.0000 "application " | OPHTHALMIC | Status: DC | PRN
  Administered 2016-01-15 (×2): 1 via OPHTHALMIC

## 2016-01-15 MED ORDER — CEFUROXIME OPHTHALMIC INJECTION 1 MG/0.1 ML
INJECTION | OPHTHALMIC | Status: DC | PRN
  Administered 2016-01-15: 0.1 mL via INTRACAMERAL

## 2016-01-15 MED ORDER — EPINEPHRINE HCL 1 MG/ML IJ SOLN
INTRAOCULAR | Status: DC | PRN
  Administered 2016-01-15: 54 mL via OPHTHALMIC

## 2016-01-15 MED ORDER — TETRACAINE HCL 0.5 % OP SOLN
1.0000 [drp] | OPHTHALMIC | Status: DC | PRN
  Administered 2016-01-15: 1 [drp] via OPHTHALMIC

## 2016-01-15 MED ORDER — MIDAZOLAM HCL 2 MG/2ML IJ SOLN
INTRAMUSCULAR | Status: DC | PRN
  Administered 2016-01-15: 2 mg via INTRAVENOUS

## 2016-01-15 MED ORDER — FENTANYL CITRATE (PF) 100 MCG/2ML IJ SOLN
INTRAMUSCULAR | Status: DC | PRN
  Administered 2016-01-15: 100 ug via INTRAVENOUS

## 2016-01-15 MED ORDER — LACTATED RINGERS IV SOLN: INTRAVENOUS | Status: DC

## 2016-01-15 MED ORDER — BRIMONIDINE TARTRATE 0.2 % OP SOLN
OPHTHALMIC | Status: DC | PRN
  Administered 2016-01-15: 1 [drp] via OPHTHALMIC

## 2016-01-15 MED ORDER — NA HYALUR & NA CHOND-NA HYALUR 0.4-0.35 ML IO KIT
PACK | INTRAOCULAR | Status: DC | PRN
  Administered 2016-01-15: 1 mL via INTRAOCULAR

## 2016-01-15 SURGICAL SUPPLY — 26 items
CANNULA ANT/CHMB 27GA (MISCELLANEOUS) ×3 IMPLANT
CARTRIDGE ABBOTT (MISCELLANEOUS) ×3 IMPLANT
GLOVE SURG LX 7.5 STRW (GLOVE) ×2
GLOVE SURG LX STRL 7.5 STRW (GLOVE) ×1 IMPLANT
GLOVE SURG TRIUMPH 8.0 PF LTX (GLOVE) ×3 IMPLANT
GOWN STRL REUS W/ TWL LRG LVL3 (GOWN DISPOSABLE) ×2 IMPLANT
GOWN STRL REUS W/TWL LRG LVL3 (GOWN DISPOSABLE) ×4
LENS IOL TECNIS ITEC 20.5 (Intraocular Lens) ×3 IMPLANT
MARKER SKIN DUAL TIP RULER LAB (MISCELLANEOUS) ×3 IMPLANT
NDL RETROBULBAR .5 NSTRL (NEEDLE) IMPLANT
NEEDLE FILTER BLUNT 18X 1/2SAF (NEEDLE) ×2
NEEDLE FILTER BLUNT 18X1 1/2 (NEEDLE) ×1 IMPLANT
PACK CATARACT BRASINGTON (MISCELLANEOUS) ×3 IMPLANT
PACK EYE AFTER SURG (MISCELLANEOUS) ×3 IMPLANT
PACK OPTHALMIC (MISCELLANEOUS) ×3 IMPLANT
RING MALYGIN 7.0 (MISCELLANEOUS) IMPLANT
SUT ETHILON 10-0 CS-B-6CS-B-6 (SUTURE)
SUT VICRYL  9 0 (SUTURE)
SUT VICRYL 9 0 (SUTURE) IMPLANT
SUTURE EHLN 10-0 CS-B-6CS-B-6 (SUTURE) IMPLANT
SYR 3ML LL SCALE MARK (SYRINGE) ×3 IMPLANT
SYR 5ML LL (SYRINGE) IMPLANT
SYR TB 1ML LUER SLIP (SYRINGE) ×3 IMPLANT
WATER STERILE IRR 250ML POUR (IV SOLUTION) ×3 IMPLANT
WATER STERILE IRR 500ML POUR (IV SOLUTION) IMPLANT
WIPE NON LINTING 3.25X3.25 (MISCELLANEOUS) ×3 IMPLANT

## 2016-01-15 NOTE — H&P (Signed)
  The History and Physical notes are on paper, have been signed, and are to be scanned. The patient remains stable and unchanged from the H&P.   Previous H&P reviewed, patient examined, and there are no changes.  Meghin Thivierge 01/15/2016 10:17 AM

## 2016-01-15 NOTE — Transfer of Care (Signed)
Immediate Anesthesia Transfer of Care Note  Patient: Donald Chapman  Procedure(s) Performed: Procedure(s): CATARACT EXTRACTION PHACO AND INTRAOCULAR LENS PLACEMENT (IOC) (Right)  Patient Location: PACU  Anesthesia Type: MAC  Level of Consciousness: awake, alert  and patient cooperative  Airway and Oxygen Therapy: Patient Spontanous Breathing and Patient connected to supplemental oxygen  Post-op Assessment: Post-op Vital signs reviewed, Patient's Cardiovascular Status Stable, Respiratory Function Stable, Patent Airway and No signs of Nausea or vomiting  Post-op Vital Signs: Reviewed and stable  Complications: No apparent anesthesia complications

## 2016-01-15 NOTE — Anesthesia Preprocedure Evaluation (Signed)
Anesthesia Evaluation  Patient identified by MRN, date of birth, ID band  Reviewed: Allergy & Precautions, H&P , NPO status , Patient's Chart, lab work & pertinent test results  Airway Mallampati: II  TM Distance: >3 FB Neck ROM: full    Dental no notable dental hx.    Pulmonary    Pulmonary exam normal        Cardiovascular hypertension,  Rhythm:regular Rate:Normal     Neuro/Psych    GI/Hepatic   Endo/Other  Hypothyroidism   Renal/GU      Musculoskeletal   Abdominal   Peds  Hematology   Anesthesia Other Findings   Reproductive/Obstetrics                             Anesthesia Physical Anesthesia Plan  ASA: II  Anesthesia Plan: MAC   Post-op Pain Management:    Induction:   Airway Management Planned:   Additional Equipment:   Intra-op Plan:   Post-operative Plan:   Informed Consent: I have reviewed the patients History and Physical, chart, labs and discussed the procedure including the risks, benefits and alternatives for the proposed anesthesia with the patient or authorized representative who has indicated his/her understanding and acceptance.     Plan Discussed with: CRNA  Anesthesia Plan Comments:         Anesthesia Quick Evaluation

## 2016-01-15 NOTE — Op Note (Signed)
LOCATION:  [place]   PREOPERATIVE DIAGNOSIS:    Nuclear sclerotic cataract right eye. H25.11   POSTOPERATIVE DIAGNOSIS:  Nuclear sclerotic cataract right eye.     PROCEDURE:  Phacoemusification with posterior chamber intraocular lens placement of the right eye   LENS:   Implant Name Type Inv. Item Serial No. Manufacturer Lot No. LRB No. Used  technis aspheric iol pre loaded     [id number] [facility]   Right 1     PCB00 20.5 D PCIOL   ULTRASOUND TIME: 14 % of 0 minutes, 51 seconds.  CDE 7.4   SURGEON:  Wyonia Hough, MD   ANESTHESIA:  Topical with tetracaine drops and 2% Xylocaine jelly.   COMPLICATIONS:  None.   DESCRIPTION OF PROCEDURE:  The patient was identified in the holding room and transported to the operating room and placed in the supine position under the operating microscope.  The right eye was identified as the operative eye and it was prepped and draped in the usual sterile ophthalmic fashion.   A 1 millimeter clear-corneal paracentesis was made at the 12:00 position.  The anterior chamber was filled with Viscoat viscoelastic.  A 2.4 millimeter keratome was used to make a near-clear corneal incision at the 9:00 position.  A curvilinear capsulorrhexis was made with a cystotome and capsulorrhexis forceps.  Balanced salt solution was used to hydrodissect and hydrodelineate the nucleus.   Phacoemulsification was then used in stop and chop fashion to remove the lens nucleus and epinucleus.  The remaining cortex was then removed using the irrigation and aspiration handpiece. Provisc was then placed into the capsular bag to distend it for lens placement.  A lens was then injected into the capsular bag.  The remaining viscoelastic was aspirated.   Wounds were hydrated with balanced salt solution.  The anterior chamber was inflated to a physiologic pressure with balanced salt solution.  No wound leaks were noted. Cefuroxime 0.1 ml of a '10mg'$ /ml solution was  injected into the anterior chamber for a dose of 1 mg of intracameral antibiotic at the completion of the case.   Timolol and Brimonidine drops were applied to the eye.  The patient was taken to the recovery room in stable condition without complications of anesthesia or surgery.   Owynn Mosqueda 01/15/2016, 11:13 AM

## 2016-01-15 NOTE — Anesthesia Postprocedure Evaluation (Signed)
Anesthesia Post Note  Patient: Donald Chapman  Procedure(s) Performed: Procedure(s) (LRB): CATARACT EXTRACTION PHACO AND INTRAOCULAR LENS PLACEMENT (IOC) (Right)  Patient location during evaluation: PACU Anesthesia Type: MAC Level of consciousness: awake and alert and oriented Pain management: satisfactory to patient Vital Signs Assessment: post-procedure vital signs reviewed and stable Respiratory status: spontaneous breathing, nonlabored ventilation and respiratory function stable Cardiovascular status: blood pressure returned to baseline and stable Postop Assessment: Adequate PO intake and No signs of nausea or vomiting Anesthetic complications: no    Raliegh Ip

## 2016-01-16 ENCOUNTER — Encounter: Payer: Self-pay | Admitting: Ophthalmology

## 2016-05-18 ENCOUNTER — Other Ambulatory Visit: Payer: Self-pay | Admitting: Internal Medicine

## 2016-05-18 DIAGNOSIS — E890 Postprocedural hypothyroidism: Secondary | ICD-10-CM

## 2016-05-18 DIAGNOSIS — C73 Malignant neoplasm of thyroid gland: Secondary | ICD-10-CM

## 2016-06-15 ENCOUNTER — Encounter
Admission: RE | Admit: 2016-06-15 | Discharge: 2016-06-15 | Disposition: A | Discharge status: Home / Self Care | Payer: PRIVATE HEALTH INSURANCE | Source: Ambulatory Visit | Attending: Internal Medicine | Admitting: Internal Medicine

## 2016-06-15 DIAGNOSIS — C73 Malignant neoplasm of thyroid gland: Secondary | ICD-10-CM | POA: Diagnosis not present

## 2016-06-15 DIAGNOSIS — E890 Postprocedural hypothyroidism: Secondary | ICD-10-CM | POA: Diagnosis present

## 2016-06-15 MED ORDER — THYROTROPIN ALFA 1.1 MG IM SOLR
0.9000 mg | INTRAMUSCULAR | Status: AC
  Administered 2016-06-15: 0.9 mg via INTRAMUSCULAR

## 2016-06-16 ENCOUNTER — Encounter
Admission: RE | Admit: 2016-06-16 | Discharge: 2016-06-16 | Disposition: A | Discharge status: Home / Self Care | Payer: PRIVATE HEALTH INSURANCE | Source: Ambulatory Visit | Attending: Internal Medicine | Admitting: Internal Medicine

## 2016-06-16 DIAGNOSIS — C73 Malignant neoplasm of thyroid gland: Secondary | ICD-10-CM | POA: Diagnosis not present

## 2016-06-16 MED ORDER — THYROTROPIN ALFA 1.1 MG IM SOLR
0.9000 mg | INTRAMUSCULAR | Status: AC
  Administered 2016-06-16: 0.9 mg via INTRAMUSCULAR

## 2016-06-17 ENCOUNTER — Encounter
Admission: RE | Admit: 2016-06-17 | Discharge: 2016-06-17 | Disposition: A | Discharge status: Home / Self Care | Payer: PRIVATE HEALTH INSURANCE | Source: Ambulatory Visit | Attending: Internal Medicine | Admitting: Internal Medicine

## 2016-06-17 MED ORDER — SODIUM IODIDE I 131 CAPSULE
4.0000 | Freq: Once | INTRAVENOUS | Status: AC | PRN
  Administered 2016-06-17: 4.2 via ORAL

## 2016-06-19 ENCOUNTER — Encounter
Admission: RE | Admit: 2016-06-19 | Discharge: 2016-06-19 | Disposition: A | Discharge status: Home / Self Care | Payer: PRIVATE HEALTH INSURANCE | Source: Ambulatory Visit | Attending: Internal Medicine | Admitting: Internal Medicine

## 2016-06-19 DIAGNOSIS — C73 Malignant neoplasm of thyroid gland: Secondary | ICD-10-CM | POA: Diagnosis not present

## 2017-07-10 IMAGING — NM NM [ID] THYROID CANCER METS WHOLE BODY W/ THYROGEN
2 series · 6 of 6 positions shown · non-contrast
Comparison: Post remnant ablation scan 08/02/2015

CLINICAL DATA: Papillary thyroid carcinoma status post
thyroidectomy August 2015. Remnant ablation July 2015. pT3 [place]

EXAM:
THYROGEN-STIMULATED C-MLM WHOLE BODY SCAN
TECHNIQUE: The patient received 0.9 mg Thyrogen intramuscularly every 24 hours
for two doses. On the third day the patient returned and received
the radiopharmaceutical, per orally. On the fifth day, the patient
returned and whole body planar images were obtained in the anterior
and posterior projections.
RADIOPHARMACEUTICALS:  4.0 mCi C-MLM sodium iodide orally

[Series 1000: wholebody · 2.40mm/px · 2 of 2 frames shown]
[frame 1/2  full-range]
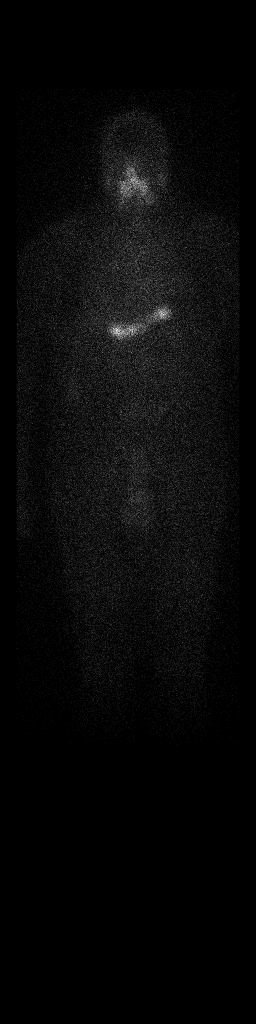
[frame 2/2  full-range]
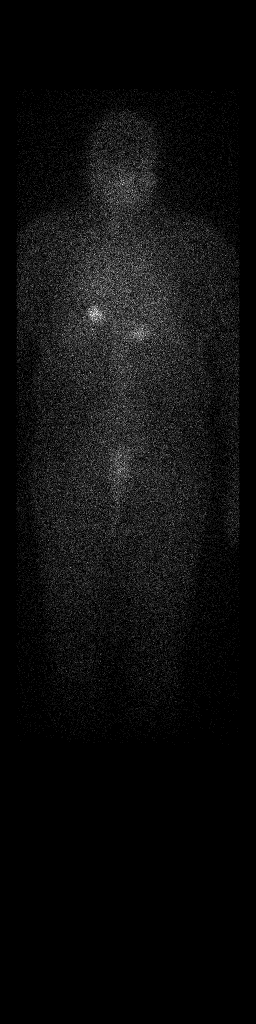

[Series 1000: statics · 2.40mm/px · 2 acquisitions, 4 frames shown]
[im 1/2  full-range]
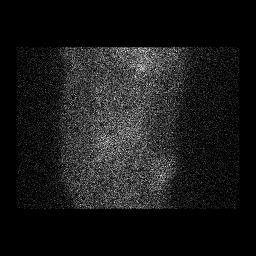
[im 1/2  full-range]
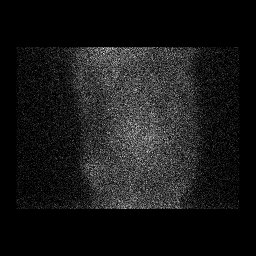
[im 2/2  full-range]
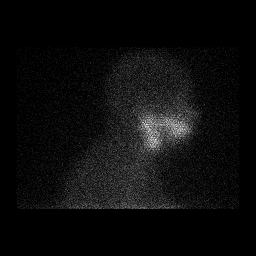
[im 2/2  full-range]
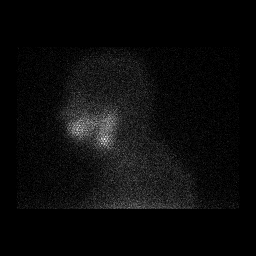

[6 of 6 positions shown; findings below may reference images not displayed]

FINDINGS: No residual or recurrent activity within the thyroid bed. No
abnormal uptake on the whole-body imaging. Physiologic uptake noted
in the nasopharynx and stomach.
IMPRESSION: No evidence of local thyroid cancer recurrence or distant
metastasis.

## 2019-07-18 ENCOUNTER — Ambulatory Visit
Admission: RE | Admit: 2019-07-18 | Discharge: 2019-07-18 | Disposition: A | Discharge status: Home / Self Care | Payer: BC Managed Care – PPO | Attending: Otolaryngology | Admitting: Otolaryngology

## 2019-07-18 ENCOUNTER — Other Ambulatory Visit: Payer: Self-pay | Admitting: Otolaryngology

## 2019-07-18 ENCOUNTER — Other Ambulatory Visit: Payer: Self-pay

## 2019-07-18 ENCOUNTER — Ambulatory Visit
Admission: RE | Admit: 2019-07-18 | Discharge: 2019-07-18 | Disposition: A | Discharge status: Home / Self Care | Payer: BC Managed Care – PPO | Source: Ambulatory Visit | Attending: Otolaryngology | Admitting: Otolaryngology

## 2019-07-18 ENCOUNTER — Other Ambulatory Visit: Payer: Self-pay | Admitting: *Deleted

## 2019-07-18 DIAGNOSIS — R059 Cough, unspecified: Secondary | ICD-10-CM

## 2019-07-18 DIAGNOSIS — R05 Cough: Secondary | ICD-10-CM

## 2019-07-19 ENCOUNTER — Other Ambulatory Visit: Payer: Self-pay | Admitting: Otolaryngology

## 2019-07-19 DIAGNOSIS — C3411 Malignant neoplasm of upper lobe, right bronchus or lung: Secondary | ICD-10-CM

## 2019-07-28 ENCOUNTER — Ambulatory Visit
Admission: RE | Admit: 2019-07-28 | Discharge: 2019-07-28 | Disposition: A | Discharge status: Home / Self Care | Payer: BC Managed Care – PPO | Source: Ambulatory Visit | Attending: Otolaryngology | Admitting: Otolaryngology

## 2019-07-28 ENCOUNTER — Other Ambulatory Visit: Payer: Self-pay

## 2019-07-28 DIAGNOSIS — C3411 Malignant neoplasm of upper lobe, right bronchus or lung: Secondary | ICD-10-CM

## 2019-07-28 MED ORDER — IOHEXOL 300 MG/ML  SOLN
75.0000 mL | Freq: Once | INTRAMUSCULAR | Status: AC | PRN
  Administered 2019-07-28: 75 mL via INTRAVENOUS

## 2020-05-30 ENCOUNTER — Other Ambulatory Visit: Payer: Self-pay | Admitting: Internal Medicine

## 2020-05-30 DIAGNOSIS — Z85850 Personal history of malignant neoplasm of thyroid: Secondary | ICD-10-CM

## 2020-05-30 DIAGNOSIS — E890 Postprocedural hypothyroidism: Secondary | ICD-10-CM

## 2020-06-17 ENCOUNTER — Other Ambulatory Visit: Payer: Self-pay

## 2020-06-17 ENCOUNTER — Encounter
Admission: RE | Admit: 2020-06-17 | Discharge: 2020-06-17 | Disposition: A | Discharge status: Home / Self Care | Payer: BC Managed Care – PPO | Source: Ambulatory Visit | Attending: Internal Medicine | Admitting: Internal Medicine

## 2020-06-17 DIAGNOSIS — E890 Postprocedural hypothyroidism: Secondary | ICD-10-CM | POA: Diagnosis present

## 2020-06-17 DIAGNOSIS — Z85850 Personal history of malignant neoplasm of thyroid: Secondary | ICD-10-CM | POA: Insufficient documentation

## 2020-06-17 MED ORDER — THYROTROPIN ALFA 1.1 MG IM SOLR
0.9000 mg | INTRAMUSCULAR | Status: AC
  Administered 2020-06-17: 0.9 mg via INTRAMUSCULAR
  Filled 2020-06-17: qty 0.9

## 2020-06-18 ENCOUNTER — Encounter
Admission: RE | Admit: 2020-06-18 | Discharge: 2020-06-18 | Disposition: A | Discharge status: Home / Self Care | Payer: BC Managed Care – PPO | Source: Ambulatory Visit | Attending: Internal Medicine | Admitting: Internal Medicine

## 2020-06-18 DIAGNOSIS — E890 Postprocedural hypothyroidism: Secondary | ICD-10-CM | POA: Diagnosis not present

## 2020-06-18 MED ORDER — THYROTROPIN ALFA 1.1 MG IM SOLR
0.9000 mg | INTRAMUSCULAR | Status: AC
  Administered 2020-06-18: 0.9 mg via INTRAMUSCULAR
  Filled 2020-06-18: qty 0.9

## 2020-06-19 ENCOUNTER — Encounter
Admission: RE | Admit: 2020-06-19 | Discharge: 2020-06-19 | Disposition: A | Discharge status: Home / Self Care | Payer: BC Managed Care – PPO | Source: Ambulatory Visit | Attending: Internal Medicine | Admitting: Internal Medicine

## 2020-06-19 DIAGNOSIS — E890 Postprocedural hypothyroidism: Secondary | ICD-10-CM | POA: Diagnosis not present

## 2020-06-19 MED ORDER — SODIUM IODIDE I 131 CAPSULE
3.9880 | Freq: Once | INTRAVENOUS | Status: AC | PRN
  Administered 2020-06-19: 3.988 via ORAL

## 2020-06-21 ENCOUNTER — Encounter
Admission: RE | Admit: 2020-06-21 | Discharge: 2020-06-21 | Disposition: A | Discharge status: Home / Self Care | Payer: BC Managed Care – PPO | Source: Ambulatory Visit | Attending: Internal Medicine | Admitting: Internal Medicine

## 2020-06-21 DIAGNOSIS — E890 Postprocedural hypothyroidism: Secondary | ICD-10-CM | POA: Diagnosis not present

## 2021-07-12 IMAGING — NM NM [ID] THYROID CANCER METS WHOLE BODY W/ THYROGEN
2 series · 4 of 4 positions shown · non-contrast
Comparison: 06/19/2016

CLINICAL DATA: Thyroid cancer post thyroidectomy and postoperative
radioactive iodine ablation in 3730

EXAM:
THYROGEN-STIMULATED E-FXF WHOLE BODY SCAN
TECHNIQUE: The patient received 0.9 mg Thyrogen intramuscularly every 24 hours
for two doses. On the third day the patient returned and received
the radiopharmaceutical, per orally. On the fifth day, the patient
returned and whole body planar images were obtained in the anterior
and posterior projections.
RADIOPHARMACEUTICALS:  3.988 mCi E-FXF sodium iodide orally

[Series 1000: statics · 2.40mm/px · 2 of 2 frames shown]
[frame 1/2  full-range]
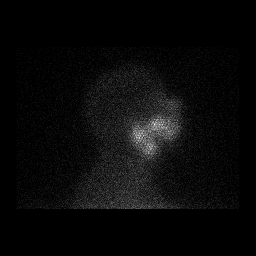
[frame 2/2  full-range]
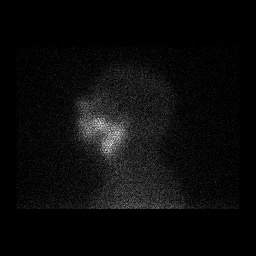

[Series 1000: wholebody-i 131 · 2.40mm/px · 2 of 2 frames shown]
[frame 1/2  full-range]
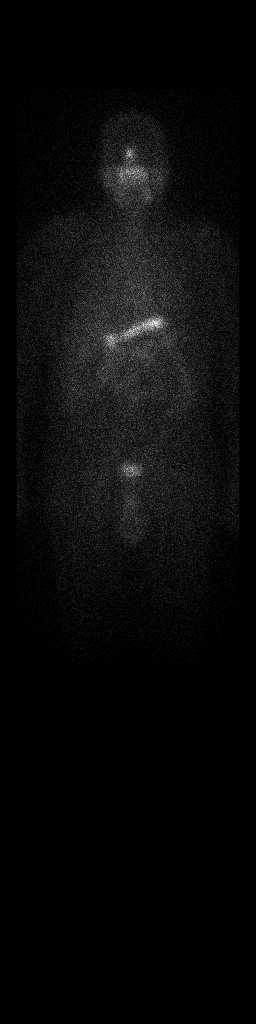
[frame 2/2  full-range]
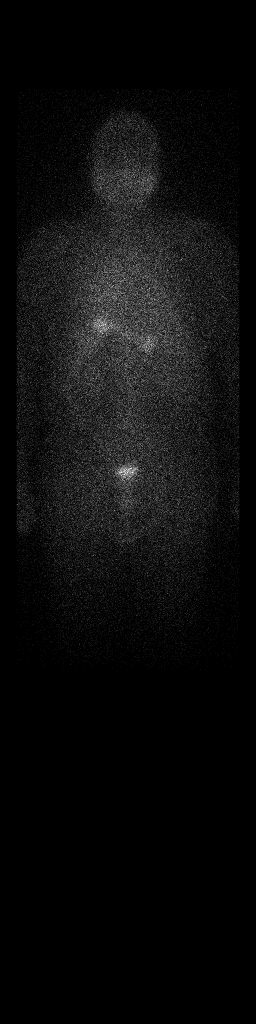

[4 of 4 positions shown; findings below may reference images not displayed]

FINDINGS: Physiologic contrast excretion within stomach.

Excreted contrast within colon and bladder.

No abnormal sites of radio iodine accumulation to suggest iodine
avid metastatic thyroid cancer.

No residual tracer uptake within the thyroid bed or cervical region.
IMPRESSION: No scintigraphic evidence of iodine-avid recurrent or metastatic
thyroid cancer.

## 2022-10-23 ENCOUNTER — Other Ambulatory Visit: Payer: Self-pay | Admitting: Internal Medicine

## 2022-10-23 DIAGNOSIS — Z85850 Personal history of malignant neoplasm of thyroid: Secondary | ICD-10-CM

## 2022-11-11 ENCOUNTER — Ambulatory Visit
Admission: RE | Admit: 2022-11-11 | Discharge: 2022-11-11 | Disposition: A | Discharge status: Home / Self Care | Payer: BC Managed Care – PPO | Source: Ambulatory Visit | Attending: Internal Medicine | Admitting: Internal Medicine

## 2022-11-11 VITALS — Wt 212.0 lb

## 2022-11-11 DIAGNOSIS — Z85850 Personal history of malignant neoplasm of thyroid: Secondary | ICD-10-CM | POA: Diagnosis present

## 2022-11-11 DIAGNOSIS — I700 Atherosclerosis of aorta: Secondary | ICD-10-CM | POA: Diagnosis not present

## 2022-11-11 LAB — GLUCOSE, CAPILLARY: Glucose-Capillary: 118 mg/dL — ABNORMAL HIGH (ref 70–99)

## 2022-11-11 MED ORDER — FLUDEOXYGLUCOSE F - 18 (FDG) INJECTION
11.0300 | Freq: Once | INTRAVENOUS | Status: AC | PRN
  Administered 2022-11-11: 11.03 via INTRAVENOUS
